# Patient Record
Sex: Male | Born: 1975 | Race: White | Hispanic: No | Marital: Single
Health system: Southern US, Community
[De-identification: ages and names within clinical notes are randomized; demographics above are authoritative.]

---

## 2007-06-24 ENCOUNTER — Emergency Department: Payer: Self-pay | Admitting: Unknown Physician Specialty
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
## 2019-04-27 ENCOUNTER — Ambulatory Visit: Payer: 59 | Attending: Internal Medicine

## 2019-04-27 DIAGNOSIS — Z23 Encounter for immunization: Secondary | ICD-10-CM

## 2019-04-27 NOTE — Progress Notes (Signed)
   Covid-[facility] Vaccination [facility]  Name:  Hunter Hunter    MRN: [id number] DOB: 1975-06-11  04/27/2019  Hunter Hunter was observed post Covid-19 immunization for 15 minutes without incident. He was provided with Vaccine Information Sheet and instruction to access the V-Safe system.   Hunter Hunter was instructed to call 911 with any severe reactions post vaccine: Marland Kitchen Difficulty breathing  . Swelling of face and throat  . A fast heartbeat  . A bad rash all over body  . Dizziness and weakness   Immunizations Administered    Name Date Dose VIS Date Route   Pfizer COVID-19 Vaccine 04/27/2019  8:57 AM 0.3 mL 01/25/2019 Intramuscular   Manufacturer: [employer], [employer]   Lot: [id number]   NDC: [address]-7

## 2019-05-21 ENCOUNTER — Ambulatory Visit: Payer: 59 | Attending: Internal Medicine

## 2019-05-21 DIAGNOSIS — Z23 Encounter for immunization: Secondary | ICD-10-CM

## 2019-05-21 NOTE — Progress Notes (Signed)
   Covid-19 Vaccination Clinic  Name:  Leelyn Jasinski    MRN: [id number] DOB: 06/01/1975  05/21/2019  Mr. Hughley was observed post Covid-19 immunization for 15 minutes without incident. He was provided with Vaccine Information Sheet and instruction to access the V-Safe system.   Mr. Kruse was instructed to call 911 with any severe reactions post vaccine: Marland Kitchen Difficulty breathing  . Swelling of face and throat  . A fast heartbeat  . A bad rash all over body  . Dizziness and weakness   Immunizations Administered    Name Date Dose VIS Date Route   Pfizer COVID-19 Vaccine 05/21/2019 11:22 AM 0.3 mL 01/25/2019 Intramuscular   Manufacturer: [employer], [employer]   Lot: [id number]   NDC: [address]-6

## 2020-10-07 ENCOUNTER — Other Ambulatory Visit (HOSPITAL_COMMUNITY): Payer: Self-pay | Admitting: Neurosurgery

## 2020-10-07 ENCOUNTER — Other Ambulatory Visit: Payer: Self-pay | Admitting: Neurosurgery

## 2020-10-07 DIAGNOSIS — M5416 Radiculopathy, lumbar region: Secondary | ICD-10-CM

## 2020-10-07 DIAGNOSIS — M545 Low back pain, unspecified: Secondary | ICD-10-CM

## 2020-10-16 ENCOUNTER — Ambulatory Visit: Payer: 59
